# Patient Record
Sex: Female | Born: 1965
Health system: Southern US, Community
[De-identification: ages and names within clinical notes are randomized; demographics above are authoritative.]

## PROBLEM LIST (undated history)

## (undated) DIAGNOSIS — E669 Obesity, unspecified: Secondary | ICD-10-CM

## (undated) DIAGNOSIS — E039 Hypothyroidism, unspecified: Secondary | ICD-10-CM

## (undated) HISTORY — DX: Obesity, unspecified: E66.9

## (undated) HISTORY — PX: LAPAROSCOPIC GASTRIC BANDING: SHX1100

---
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
## 1998-01-09 ENCOUNTER — Emergency Department (HOSPITAL_COMMUNITY): Admission: EM | Admit: 1998-01-09 | Discharge: 1998-01-09 | Payer: Self-pay

## 1998-02-08 ENCOUNTER — Other Ambulatory Visit: Admission: RE | Admit: 1998-02-08 | Discharge: 1998-02-08 | Payer: Self-pay | Admitting: Obstetrics & Gynecology

## 1998-06-11 ENCOUNTER — Encounter: Payer: Self-pay | Admitting: Obstetrics and Gynecology

## 1998-06-11 ENCOUNTER — Inpatient Hospital Stay (HOSPITAL_COMMUNITY): Admission: AD | Admit: 1998-06-11 | Discharge: 1998-06-15 | Payer: Self-pay | Admitting: Obstetrics & Gynecology

## 1998-06-16 ENCOUNTER — Encounter (HOSPITAL_COMMUNITY): Admission: RE | Admit: 1998-06-16 | Discharge: 1998-09-14 | Payer: Self-pay | Admitting: Obstetrics & Gynecology

## 1998-07-08 ENCOUNTER — Other Ambulatory Visit: Admission: RE | Admit: 1998-07-08 | Discharge: 1998-07-08 | Payer: Self-pay | Admitting: Obstetrics & Gynecology

## 2000-05-06 ENCOUNTER — Emergency Department (HOSPITAL_COMMUNITY): Admission: EM | Admit: 2000-05-06 | Discharge: 2000-05-06 | Payer: Self-pay | Admitting: Internal Medicine

## 2001-02-21 ENCOUNTER — Encounter: Admission: RE | Admit: 2001-02-21 | Discharge: 2001-05-22 | Payer: Self-pay | Admitting: Family Medicine

## 2003-10-20 ENCOUNTER — Other Ambulatory Visit: Admission: RE | Admit: 2003-10-20 | Discharge: 2003-10-20 | Payer: Self-pay | Admitting: Obstetrics & Gynecology

## 2005-04-17 ENCOUNTER — Other Ambulatory Visit: Admission: RE | Admit: 2005-04-17 | Discharge: 2005-04-17 | Payer: Self-pay | Admitting: Obstetrics & Gynecology

## 2008-11-06 ENCOUNTER — Ambulatory Visit (HOSPITAL_COMMUNITY): Admission: RE | Admit: 2008-11-06 | Discharge: 2008-11-06 | Payer: Self-pay | Admitting: General Surgery

## 2009-01-18 ENCOUNTER — Encounter: Admission: RE | Admit: 2009-01-18 | Discharge: 2009-04-18 | Payer: Self-pay | Admitting: General Surgery

## 2009-06-03 ENCOUNTER — Encounter: Admission: RE | Admit: 2009-06-03 | Discharge: 2009-09-01 | Payer: Self-pay | Admitting: General Surgery

## 2009-06-29 ENCOUNTER — Ambulatory Visit (HOSPITAL_COMMUNITY): Admission: RE | Admit: 2009-06-29 | Discharge: 2009-06-30 | Payer: Self-pay | Admitting: General Surgery

## 2009-10-11 ENCOUNTER — Encounter
Admission: RE | Admit: 2009-10-11 | Discharge: 2009-10-11 | Payer: Self-pay | Source: Home / Self Care | Attending: General Surgery | Admitting: General Surgery

## 2010-01-11 ENCOUNTER — Encounter
Admission: RE | Admit: 2010-01-11 | Discharge: 2010-02-01 | Payer: Self-pay | Source: Home / Self Care | Attending: General Surgery | Admitting: General Surgery

## 2010-03-20 LAB — SURGICAL PCR SCREEN
MRSA, PCR: NEGATIVE
Staphylococcus aureus: POSITIVE — AB

## 2010-03-20 LAB — CBC
HCT: 34.2 % — ABNORMAL LOW (ref 36.0–46.0)
HCT: 38.5 % (ref 36.0–46.0)
Hemoglobin: 11.6 g/dL — ABNORMAL LOW (ref 12.0–15.0)
Hemoglobin: 12.6 g/dL (ref 12.0–15.0)
MCH: 29.3 pg (ref 26.0–34.0)
MCH: 30.3 pg (ref 26.0–34.0)
MCHC: 32.7 g/dL (ref 30.0–36.0)
MCHC: 34 g/dL (ref 30.0–36.0)
MCV: 89 fL (ref 78.0–100.0)
MCV: 89.6 fL (ref 78.0–100.0)
Platelets: 281 10*3/uL (ref 150–400)
Platelets: 290 10*3/uL (ref 150–400)
RBC: 3.84 MIL/uL — ABNORMAL LOW (ref 3.87–5.11)
RBC: 4.29 MIL/uL (ref 3.87–5.11)
RDW: 15.5 % (ref 11.5–15.5)
RDW: 15.5 % (ref 11.5–15.5)
WBC: 5.2 10*3/uL (ref 4.0–10.5)
WBC: 9.5 10*3/uL (ref 4.0–10.5)

## 2010-03-20 LAB — DIFFERENTIAL
Basophils Absolute: 0 10*3/uL (ref 0.0–0.1)
Basophils Relative: 0 % (ref 0–1)
Eosinophils Absolute: 0 10*3/uL (ref 0.0–0.7)
Eosinophils Absolute: 0.1 10*3/uL (ref 0.0–0.7)
Eosinophils Relative: 0 % (ref 0–5)
Eosinophils Relative: 2 % (ref 0–5)
Lymphocytes Relative: 14 % (ref 12–46)
Lymphocytes Relative: 47 % — ABNORMAL HIGH (ref 12–46)
Lymphs Abs: 1.3 10*3/uL (ref 0.7–4.0)
Lymphs Abs: 2.5 10*3/uL (ref 0.7–4.0)
Monocytes Absolute: 0.5 10*3/uL (ref 0.1–1.0)
Monocytes Absolute: 0.6 10*3/uL (ref 0.1–1.0)
Monocytes Relative: 10 % (ref 3–12)
Monocytes Relative: 6 % (ref 3–12)
Neutro Abs: 2.1 10*3/uL (ref 1.7–7.7)
Neutrophils Relative %: 40 % — ABNORMAL LOW (ref 43–77)

## 2010-03-20 LAB — HEMOGLOBIN AND HEMATOCRIT, BLOOD
HCT: 35.6 % — ABNORMAL LOW (ref 36.0–46.0)
Hemoglobin: 12.2 g/dL (ref 12.0–15.0)

## 2010-03-20 LAB — COMPREHENSIVE METABOLIC PANEL
ALT: 22 U/L (ref 0–35)
Albumin: 4 g/dL (ref 3.5–5.2)
Alkaline Phosphatase: 70 U/L (ref 39–117)
BUN: 8 mg/dL (ref 6–23)
Chloride: 104 mEq/L (ref 96–112)
Glucose, Bld: 81 mg/dL (ref 70–99)
Potassium: 4.4 mEq/L (ref 3.5–5.1)
Sodium: 137 mEq/L (ref 135–145)
Total Bilirubin: 0.9 mg/dL (ref 0.3–1.2)
Total Protein: 7.9 g/dL (ref 6.0–8.3)

## 2012-01-18 ENCOUNTER — Telehealth (INDEPENDENT_AMBULATORY_CARE_PROVIDER_SITE_OTHER): Payer: Self-pay | Admitting: General Surgery

## 2012-01-18 NOTE — Telephone Encounter (Signed)
01/18/12 spoke to patient about making a bariatric surgery follow-up with Dr. Johna Sheriff for her lap band. She was last seen 02/02/10 for a lap band fill adjustment. Pt stated "she is doing fine-doesn't feel that she needs an appt at this time." Also mailed a recall letter as a reminder. (lss)

## 2013-02-12 ENCOUNTER — Ambulatory Visit (INDEPENDENT_AMBULATORY_CARE_PROVIDER_SITE_OTHER): Payer: BC Managed Care – PPO | Admitting: General Surgery

## 2013-02-12 ENCOUNTER — Encounter (INDEPENDENT_AMBULATORY_CARE_PROVIDER_SITE_OTHER): Payer: Self-pay | Admitting: General Surgery

## 2013-02-12 DIAGNOSIS — Z4651 Encounter for fitting and adjustment of gastric lap band: Secondary | ICD-10-CM

## 2013-02-12 NOTE — Progress Notes (Signed)
Chief complaint: Followup lap band for morbid obesity  History: Patient returns for followup with a history of lap band placement in June of 2011. At that time she presented with a BMI of 53.2 at 393 pounds. Following her lap band she initially had very good success after her first several fills. The last time I saw her was February 2012 at which point she had lost 75 pounds and she states she lost another 20 pounds after that 2 of almost 100 pound weight loss. However at that time she had a lot of changes in her life and schedule and job and she has not returned for followup since. She feels that she can't fell off the wagon in terms of her exercise and eating strategies. She also feels that she is not having much restriction anymore from the band. She actually attended a recent seminar for a bariatric program interested in possible revisional surgery. However after going to the seminar she has concerns about the extensive nature of stable procedures and also felt like she never really the "green zone" with her band. She is not having any vomiting or reflux or obstructive symptoms. She feels like she is often not making good food choices at her exercise hs slacked off. She has remained remarkably free of comorbidities. 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 No pertinent past medical history. Past Surgical History  Procedure Laterality Date  . Tonsillectomy  1977  . Cesarean section    . Laparoscopic gastric banding     No current outpatient prescriptions on file.   No current facility-administered medications for this visit.   Not on File Exam: BP 130/86  Pulse 84  Temp(Src) 98.5 F (36.9 C) (Oral)  Resp 16  Ht 6' (1.829 m)  Wt 371 lb 6.4 oz (168.466 kg)  BMI 50.36 kg/m2 Total weight loss 21 pounds, up to 53 pounds from February of 2012 Gen.: Morbidly obese but otherwise well-appearing African female Abdomen: Soft and nontender. Port site looks  fine. No hernias.  Assessment and plan: Morbid obesity, status post lap band placement June 2011. She initially had excellent weight loss but has not had followup in a number of years. We discussed the importance of close regular followup and adjustments with the lap band. In terms of revisional surgery I do not feel that we have really fully utilized her band to its potential and I would not recommend revisional surgery in 2 weeks you are best with the band. She would very much like to try to get back on track with this. We discussed diet and exercise strategies. We're going to give her a refresher course with the dietitian.  We went ahead with a fill today. I accessed her band and found 9.25 cc in and 1/2 cc to bring her up to 9.75 cc. She was able to tolerate water well. We will see her back in 4 weeks and I stressed the importance of regular followup. Based on her initial success I think there is a good chance we can get things back on track.

## 2013-02-12 NOTE — Patient Instructions (Signed)
Begin eating very cautiously with small bites, chewing very well and eating very slowly. Work up to 45 minutes of exercise at least 4 times per week.

## 2013-03-20 ENCOUNTER — Encounter (INDEPENDENT_AMBULATORY_CARE_PROVIDER_SITE_OTHER): Payer: Self-pay

## 2013-03-20 ENCOUNTER — Ambulatory Visit (INDEPENDENT_AMBULATORY_CARE_PROVIDER_SITE_OTHER): Payer: BC Managed Care – PPO | Admitting: Physician Assistant

## 2013-03-20 VITALS — BP 158/98 | HR 82 | Temp 98.6°F | Ht 72.0 in | Wt 369.4 lb

## 2013-03-20 DIAGNOSIS — Z4651 Encounter for fitting and adjustment of gastric lap band: Secondary | ICD-10-CM

## 2013-03-20 NOTE — Patient Instructions (Signed)

## 2013-03-20 NOTE — Progress Notes (Signed)
HISTORY:  Yolanda Sullivan  is a 48 y.o.female who received an AP-Large lap-band in June 2011 by Dr. Johna SheriffHoxworth. She comes in today with 2 lbs weight loss since her last visit with Dr. Johna SheriffHoxworth one month ago. She had been lost to follow-up for about three years with a subsequent 50 lb weight gain. She noticed a significant difference after her fill in February but at this point she feels she could use a smaller adjustment to help get her into the green zone. She has no complaints of regurgitation or reflux.   PHYSICAL EXAM:  Physical exam reveals a very well-appearing 48 y.o.female in no apparent distress  Neurologic: Awake, alert, oriented  Psych: Bright affect, conversant  Respiratory: Breathing even and unlabored. No stridor or wheezing  Abdomen: Soft, nontender, nondistended to palpation. Incisions well-healed. No incisional hernias. Port easily palpated.  Extremities: Atraumatic, good range of motion.  ASSESMENT:  48 y.o. female s/p AP-Large lap-band.  PLAN:  The patient's port was accessed with a 20G Huber needle without difficulty. Clear fluid was aspirated and 0.25 mL saline was added to the port to give a total predicted volume of 10 mL. The patient was able to swallow water without difficulty following the procedure and was instructed to take clear liquids for the next 24-48 hours and advance slowly as tolerated.

## 2013-03-27 ENCOUNTER — Encounter: Payer: Self-pay | Admitting: Dietician

## 2013-03-27 ENCOUNTER — Encounter: Payer: BC Managed Care – PPO | Attending: General Surgery | Admitting: Dietician

## 2013-03-27 DIAGNOSIS — Z713 Dietary counseling and surveillance: Secondary | ICD-10-CM | POA: Insufficient documentation

## 2013-03-27 NOTE — Patient Instructions (Signed)
Goals:  Follow the Pre-Op Diet for 2 Weeks, then:   Follow Bariatric Surgery Specialized Post-Op Diet   Aim for maximum of 15 grams of carbs per meal/15-20 grams per snack  Avoid white starches and starchy veggies (potatoes, peas, corn, etc)  Eat 3-6 small meals/snacks, every 3-5 hrs  Increase lean protein foods to meet 60-80g goal  Always have a protein source with carbs  Increase fluid intake to 64oz +  Don't 15 minutes before up through 30 minutes after a meal or snack  Aim for >30 min of physical activity daily   Resume daily vitamin supplementation   Calcium and multivitamin

## 2013-03-27 NOTE — Progress Notes (Signed)
  Medical Nutrition Therapy:  Appt start time: 0930 end time:  1010.  Primary concerns today: Post-operative Bariatric Surgery Nutrition Management. Yolanda Sullivan is here today for a bariatric refresher. Had surgery (LAGB) in 2011 and lost about 70-80 lbs but has since since gained it back. Had about 1 or 2 fills about surgery but never found a green zone. Starting seeing Dr. Johna SheriffHoxworth again about 1 month ago and has 2 fill since. Now feels like not quite in the green zone yet (in yellow zone).   States she feels very ready to jump start back into losing weight  Preferred Learning Style:   No preference indicated   Learning Readiness:   Ready  24-hr recall: B (AM): coffee with oatmeal and banana or sausage egg and cheese croissant Snk (AM): not usually, sometimes almonds  L (PM): sandwich with [place] or tuna club or big salad with eggs, cheese, [place] and sometimes tuna, pineapples, raisins, veggies Snk (PM): sometimes will have a candy bar  D (PM): meat with vegetable and a starch  Snk (PM): none  Fluid intake: water or diet green tea or minute maid light lemonade (about 60-70 oz) Estimated total protein intake: unknown  Medications: none Supplementation: not taking  Using straws: Not unless at a restaurant Drinking while eating: Yes Hair loss: No Carbonated beverages: sparkling wine occassionally  N/V/D/C: vomited 2 x after d/t not chewing well  Last Lap-Band fill: 03/20/2013, 0.25 cc added total of 10 cc  Recent physical activity:  zumba 1-2 x week for 60 minutes  Progress Towards Goal(s):  In progress.  Handouts given during visit include:  Pre-Op Diet  Special Bariatric Post Op Diet   Nutritional Diagnosis:  [place]-3.3 Overweight/obesity related to past poor dietary habits and physical inactivity as evidenced by patient w/ hx of  LAGB surgery following dietary guidelines for continued weight loss.    Intervention:  Nutrition education.  Teaching Method Utilized:   Visual Auditory Hands on  Barriers to learning/adherence to lifestyle change: none  Demonstrated degree of understanding via:  Teach Back   Monitoring/Evaluation:  Dietary intake, exercise, lap band fills, and body weight. Follow up in 6 weeks

## 2013-04-24 ENCOUNTER — Encounter (INDEPENDENT_AMBULATORY_CARE_PROVIDER_SITE_OTHER): Payer: BC Managed Care – PPO

## 2013-05-08 ENCOUNTER — Ambulatory Visit: Payer: BC Managed Care – PPO | Admitting: Dietician

## 2013-05-08 ENCOUNTER — Encounter (INDEPENDENT_AMBULATORY_CARE_PROVIDER_SITE_OTHER): Payer: BC Managed Care – PPO

## 2013-05-15 ENCOUNTER — Ambulatory Visit: Payer: BC Managed Care – PPO | Admitting: Dietician

## 2013-05-29 ENCOUNTER — Encounter (INDEPENDENT_AMBULATORY_CARE_PROVIDER_SITE_OTHER): Payer: BC Managed Care – PPO

## 2013-06-05 ENCOUNTER — Encounter (INDEPENDENT_AMBULATORY_CARE_PROVIDER_SITE_OTHER): Payer: BC Managed Care – PPO

## 2013-06-11 ENCOUNTER — Ambulatory Visit: Payer: BC Managed Care – PPO | Admitting: Dietician

## 2013-06-19 ENCOUNTER — Encounter (INDEPENDENT_AMBULATORY_CARE_PROVIDER_SITE_OTHER): Payer: BC Managed Care – PPO

## 2014-05-12 ENCOUNTER — Other Ambulatory Visit: Payer: Self-pay | Admitting: Obstetrics & Gynecology

## 2014-05-12 DIAGNOSIS — N6312 Unspecified lump in the right breast, upper inner quadrant: Secondary | ICD-10-CM

## 2014-05-13 ENCOUNTER — Other Ambulatory Visit: Payer: Self-pay

## 2014-05-18 ENCOUNTER — Ambulatory Visit
Admission: RE | Admit: 2014-05-18 | Discharge: 2014-05-18 | Disposition: A | Discharge status: Home / Self Care | Payer: BLUE CROSS/BLUE SHIELD | Source: Ambulatory Visit | Attending: Obstetrics & Gynecology | Admitting: Obstetrics & Gynecology

## 2014-05-18 ENCOUNTER — Other Ambulatory Visit: Payer: Self-pay | Admitting: Obstetrics & Gynecology

## 2014-05-18 ENCOUNTER — Encounter (INDEPENDENT_AMBULATORY_CARE_PROVIDER_SITE_OTHER): Payer: Self-pay

## 2014-05-18 DIAGNOSIS — N6312 Unspecified lump in the right breast, upper inner quadrant: Secondary | ICD-10-CM

## 2015-10-08 ENCOUNTER — Encounter (HOSPITAL_COMMUNITY): Payer: Self-pay

## 2017-01-29 ENCOUNTER — Encounter (HOSPITAL_COMMUNITY): Payer: Self-pay

## 2017-03-20 DIAGNOSIS — H6120 Impacted cerumen, unspecified ear: Secondary | ICD-10-CM | POA: Diagnosis not present

## 2017-06-11 DIAGNOSIS — Z1231 Encounter for screening mammogram for malignant neoplasm of breast: Secondary | ICD-10-CM | POA: Diagnosis not present

## 2017-06-11 DIAGNOSIS — Z01419 Encounter for gynecological examination (general) (routine) without abnormal findings: Secondary | ICD-10-CM | POA: Diagnosis not present

## 2017-06-11 DIAGNOSIS — Z6841 Body Mass Index (BMI) 40.0 and over, adult: Secondary | ICD-10-CM | POA: Diagnosis not present

## 2017-06-23 ENCOUNTER — Emergency Department (HOSPITAL_BASED_OUTPATIENT_CLINIC_OR_DEPARTMENT_OTHER): Payer: BLUE CROSS/BLUE SHIELD

## 2017-06-23 ENCOUNTER — Other Ambulatory Visit: Payer: Self-pay

## 2017-06-23 ENCOUNTER — Emergency Department (HOSPITAL_BASED_OUTPATIENT_CLINIC_OR_DEPARTMENT_OTHER)
Admission: EM | Admit: 2017-06-23 | Discharge: 2017-06-23 | Disposition: A | Discharge status: Home / Self Care | Payer: BLUE CROSS/BLUE SHIELD | Attending: Emergency Medicine | Admitting: Emergency Medicine

## 2017-06-23 ENCOUNTER — Encounter (HOSPITAL_BASED_OUTPATIENT_CLINIC_OR_DEPARTMENT_OTHER): Payer: Self-pay | Admitting: Emergency Medicine

## 2017-06-23 DIAGNOSIS — M1712 Unilateral primary osteoarthritis, left knee: Secondary | ICD-10-CM

## 2017-06-23 DIAGNOSIS — M25562 Pain in left knee: Secondary | ICD-10-CM | POA: Diagnosis not present

## 2017-06-23 DIAGNOSIS — M7122 Synovial cyst of popliteal space [Baker], left knee: Secondary | ICD-10-CM

## 2017-06-23 DIAGNOSIS — R030 Elevated blood-pressure reading, without diagnosis of hypertension: Secondary | ICD-10-CM | POA: Diagnosis not present

## 2017-06-23 DIAGNOSIS — S8992XA Unspecified injury of left lower leg, initial encounter: Secondary | ICD-10-CM | POA: Diagnosis not present

## 2017-06-23 MED ORDER — MELOXICAM 15 MG PO TABS: 15.0000 mg | ORAL_TABLET | Freq: Every day | ORAL | 0 refills | Status: DC

## 2017-06-23 NOTE — ED Triage Notes (Signed)
Patient states that she is having pain to her kne since last Saturday - the patient states that she had hurt her left knee many years ago. Patient states that she re-injuried it today

## 2017-06-23 NOTE — ED Provider Notes (Signed)
[facility] EMERGENCY DEPARTMENT Provider Note   CSN: [id number] Arrival date & time: 06/23/17  1658     History   Chief Complaint Chief Complaint  Patient presents with  . Knee Pain    HPI Yolanda Sullivan is a 52 y.o. female with a PMHx of morbid obesity s/p lap band procedure, who presents to the ED with complaints of left knee pain x1 week.  Patient states that last weekend she was on her feet more and was more active than usual, her left knee started bothering her.  Today she twisted her knee and the pain worsened so she came in for evaluation.  She describes the pain as 9/10 constant aching and throbbing left knee pain that radiates somewhat into the lower thigh, worse with walking and initially getting up after being seated, and unrelieved with ice, heat, Advil, and Aspercreme.  She reports associated left knee swelling, as well as tingling/paresthesia in the left lateral thigh.  She denies having any issues with her knee in the past, denies having an orthopedist.  She denies falling on her knee or any other trauma or injury.  She denies any lower leg or calf swelling, numbness, focal weakness, bruises, abrasions, warmth, or any other complaints at this time.  Of note, she denies having any other medical conditions, denies having hypertension, states that she just had a full physical last week at her OB/GYN's office and her systolic blood pressure was in the 140s as it usually is.  She is not on any medications for blood pressure.  Chart review reveals that in 2015, which is the last visit in our system, her blood pressure was in the 150s/90s, however we don't have any more recent visits to compare to.  She denies any headache, vision changes, CP, SOB, abdominal pain, nausea, vomiting, or any other complaints or symptoms related to elevated blood pressure.  The history is provided by the patient and medical records. No language interpreter was used.  Knee Pain   Pertinent  negatives include no numbness.    Past Medical History:  Diagnosis Date  . Obesity     Patient Active Problem List   Diagnosis Date Noted  . Morbid obesity (HCC) 02/12/2013    Past Surgical History:  Procedure Laterality Date  . CESAREAN SECTION    . LAPAROSCOPIC GASTRIC BANDING    . TONSILLECTOMY  1977     OB History   None      Home Medications    Prior to Admission medications   Not on File    Family History Family History  Problem Relation Age of Onset  . Cancer Father        throat  . Hyperlipidemia Other   . Diabetes Other   . Hypertension Other     Social History Social History   Tobacco Use  . Smoking status: Never Smoker  . Smokeless tobacco: Never Used  Substance Use Topics  . Alcohol use: Yes    Alcohol/week: 1.2 oz    Types: 2 Glasses of wine per week    Comment: weekly  . Drug use: No     Allergies   Patient has no known allergies.   Review of Systems Review of Systems  Eyes: Negative for visual disturbance.  Respiratory: Negative for shortness of breath.   Cardiovascular: Negative for chest pain and leg swelling.  Gastrointestinal: Negative for abdominal pain, nausea and vomiting.  Musculoskeletal: Positive for arthralgias and joint swelling.  Skin: Negative for  color change.  Allergic/Immunologic: Negative for immunocompromised state.  Neurological: Negative for weakness, numbness and headaches.  Psychiatric/Behavioral: Negative for confusion.   All other systems reviewed and are negative for acute change except as noted in the HPI.    Physical Exam Updated Vital Signs BP (!) 156/67 (BP Location: Left Arm)   Pulse 82   Temp 98.1 F (36.7 C) (Oral)   Resp 20   Ht 5\' 11"  (1.803 m)   Wt (!) 158.8 kg (350 lb)   SpO2 97%   BMI 48.82 kg/m   Physical Exam  Constitutional: She is oriented to person, place, and time. Vital signs are normal. She appears well-developed and well-nourished.  Non-toxic appearance. No distress.    Afebrile, nontoxic, NAD, BP 156/67 on exam  HENT:  Head: Normocephalic and atraumatic.  Mouth/Throat: Mucous membranes are normal.  Eyes: Conjunctivae and EOM are normal. Right eye exhibits no discharge. Left eye exhibits no discharge.  Neck: Normal range of motion. Neck supple.  Cardiovascular: Normal rate and intact distal pulses.  Tachycardic in triage but HR 80s during exam  Pulmonary/Chest: Effort normal. No respiratory distress.  Abdominal: Normal appearance. She exhibits no distension.  Musculoskeletal: Normal range of motion.       Left knee: She exhibits swelling and effusion. She exhibits normal range of motion, no ecchymosis, no deformity, no laceration, no erythema, normal alignment, no LCL laxity, normal patellar mobility and no MCL laxity. Tenderness found.  L knee with FROM intact, with mild diffuse joint line TTP, ?palpable baker's cyst to medial popliteal fossa area which is also TTP, mild joint swelling/effusion, no crepitus or deformity, no bruising or erythema, no warmth, no abnormal alignment or patellar mobility, no varus/valgus laxity, neg anterior drawer test. Trace pedal edema symmetrically bilaterally, no calf tenderness/neg homan's sign bilaterally.  Strength and sensation grossly intact, distal pulses intact, compartments soft   Neurological: She is alert and oriented to person, place, and time. She has normal strength. No sensory deficit.  Skin: Skin is warm, dry and intact. No rash noted.  Psychiatric: She has a normal mood and affect. Her behavior is normal.  Nursing note and vitals reviewed.    ED Treatments / Results  Labs (all labs ordered are listed, but only abnormal results are displayed) Labs Reviewed - No data to display  EKG None  Radiology Dg Knee Complete 4 Views Left  Result Date: 06/23/2017 CLINICAL DATA:  Knee pain after twisting injury. EXAM: LEFT KNEE - COMPLETE 4+ VIEW COMPARISON:  None. FINDINGS: No acute fracture or dislocation.  Small joint effusion. Small tricompartmental osteophytes. Mild medial and patellofemoral compartment joint space narrowing. Bone mineralization is normal. Soft tissues are unremarkable. IMPRESSION: 1.  No acute osseous abnormality. 2. Mild osteoarthritis. Electronically Signed   By: Obie Dredge M.D.   On: 06/23/2017 17:59    Procedures Procedures (including critical care time)  Medications Ordered in ED Medications - No data to display   Initial Impression / Assessment and Plan / ED Course  I have reviewed the triage vital signs and the nursing notes.  Pertinent labs & imaging results that were available during my care of the patient were reviewed by me and considered in my medical decision making (see chart for details).     52 y.o. female here with L knee pain x1 week, had been on her feet more than usual last weekend and had pain start up; today twisted knee and pain worsened. On exam, trace effusion/swelling around L knee, ?palpable  baker's cyst in medial popliteal fossa which is TTP, diffuse joint line TTP, no erythema/warmth, trace b/l pedal edema which is symmetric, no calf tenderness. NVI with soft compartments. Xray obtained in triage reveals mild osteoarthritis but otherwise no acute abnormality. Symptoms likely from OA and increased strain from recent increased activity, doubt DVT or other acute emergent pathology. Will give knee sleeve and crutches for comfort, advised RICE, will rx mobic, discussed use of tylenol as needed for additional relief of symptoms, and f/up with orthopedist in 1-2wks for recheck and ongoing management of knee pain. Of note, initial BP elevated at 193/76 and HR 108 however on recheck BP 156/67 and HR 82; the initial ones were either falsely elevated due to poor cuff size or positioning, or potentially in some part due to pain. Chart review reveals that her BP has been in the 150s systolic in the past (68yrs ago is the last reading we have). Pt without  symptoms of HTN, doubt need for further emergent work up of this at this time. Advised f/up with PCP for this. DASH diet advised. I explained the diagnosis and have given explicit precautions to return to the ER including for any other new or worsening symptoms. The patient understands and accepts the medical plan as it's been dictated and I have answered their questions. Discharge instructions concerning home care and prescriptions have been given. The patient is STABLE and is discharged to home in good condition.    Final Clinical Impressions(s) / ED Diagnoses   Final diagnoses:  Acute pain of left knee  Osteoarthritis of left knee, unspecified osteoarthritis type  Baker's cyst of knee, left  Elevated blood pressure reading    ED Discharge Orders        Ordered    meloxicam (MOBIC) 15 MG tablet  Daily     06/23/17 [address], [place], [place] 06/23/17 1914    Charlynne Pander, MD 06/23/17 [phone]

## 2017-06-23 NOTE — Discharge Instructions (Addendum)
Wear knee sleeve for compression of the knee, to help with pain and swelling. Use crutches as needed for comfort. Ice and elevate knee throughout the day, using ice pack for no more than 20 minutes every hour. Use mobic daily as directed, don't use additional NSAIDs like ibuprofen/aleve/etc while taking mobic. Use additional tylenol as needed for additional pain relief. Call orthopedic follow up today or tomorrow to schedule followup appointment for recheck of ongoing knee pain in 1-2 weeks. Return to the ER for changes or worsening symptoms.  Also, your blood pressure was a little high today, which could just be from being in pain; eat a low salt/low sodium diet and follow up with your regular doctor for ongoing evaluation/management of your blood pressure.

## 2017-07-10 DIAGNOSIS — M25562 Pain in left knee: Secondary | ICD-10-CM | POA: Diagnosis not present

## 2017-07-10 DIAGNOSIS — M1712 Unilateral primary osteoarthritis, left knee: Secondary | ICD-10-CM | POA: Diagnosis not present

## 2017-07-18 DIAGNOSIS — E039 Hypothyroidism, unspecified: Secondary | ICD-10-CM | POA: Diagnosis not present

## 2017-07-30 DIAGNOSIS — M1712 Unilateral primary osteoarthritis, left knee: Secondary | ICD-10-CM | POA: Diagnosis not present

## 2017-08-02 DIAGNOSIS — M1712 Unilateral primary osteoarthritis, left knee: Secondary | ICD-10-CM | POA: Diagnosis not present

## 2017-08-06 DIAGNOSIS — M1712 Unilateral primary osteoarthritis, left knee: Secondary | ICD-10-CM | POA: Diagnosis not present

## 2017-08-09 DIAGNOSIS — M1712 Unilateral primary osteoarthritis, left knee: Secondary | ICD-10-CM | POA: Diagnosis not present

## 2017-08-13 DIAGNOSIS — M1712 Unilateral primary osteoarthritis, left knee: Secondary | ICD-10-CM | POA: Diagnosis not present

## 2017-08-17 DIAGNOSIS — M1712 Unilateral primary osteoarthritis, left knee: Secondary | ICD-10-CM | POA: Diagnosis not present

## 2017-08-22 DIAGNOSIS — M1712 Unilateral primary osteoarthritis, left knee: Secondary | ICD-10-CM | POA: Diagnosis not present

## 2017-08-27 DIAGNOSIS — M1712 Unilateral primary osteoarthritis, left knee: Secondary | ICD-10-CM | POA: Diagnosis not present

## 2017-08-28 DIAGNOSIS — M25562 Pain in left knee: Secondary | ICD-10-CM | POA: Diagnosis not present

## 2017-08-28 DIAGNOSIS — M1712 Unilateral primary osteoarthritis, left knee: Secondary | ICD-10-CM | POA: Diagnosis not present

## 2017-08-30 DIAGNOSIS — M1712 Unilateral primary osteoarthritis, left knee: Secondary | ICD-10-CM | POA: Diagnosis not present

## 2017-09-05 DIAGNOSIS — M1712 Unilateral primary osteoarthritis, left knee: Secondary | ICD-10-CM | POA: Diagnosis not present

## 2017-09-06 DIAGNOSIS — M1712 Unilateral primary osteoarthritis, left knee: Secondary | ICD-10-CM | POA: Diagnosis not present

## 2017-09-07 DIAGNOSIS — M1712 Unilateral primary osteoarthritis, left knee: Secondary | ICD-10-CM | POA: Diagnosis not present

## 2017-09-10 DIAGNOSIS — M1712 Unilateral primary osteoarthritis, left knee: Secondary | ICD-10-CM | POA: Diagnosis not present

## 2017-09-12 DIAGNOSIS — E039 Hypothyroidism, unspecified: Secondary | ICD-10-CM | POA: Diagnosis not present

## 2017-09-14 DIAGNOSIS — M1712 Unilateral primary osteoarthritis, left knee: Secondary | ICD-10-CM | POA: Diagnosis not present

## 2017-09-24 DIAGNOSIS — M1712 Unilateral primary osteoarthritis, left knee: Secondary | ICD-10-CM | POA: Diagnosis not present

## 2017-10-08 DIAGNOSIS — M1712 Unilateral primary osteoarthritis, left knee: Secondary | ICD-10-CM | POA: Diagnosis not present

## 2017-11-14 DIAGNOSIS — E039 Hypothyroidism, unspecified: Secondary | ICD-10-CM | POA: Diagnosis not present

## 2018-02-01 DIAGNOSIS — E039 Hypothyroidism, unspecified: Secondary | ICD-10-CM | POA: Diagnosis not present

## 2018-02-01 DIAGNOSIS — Z23 Encounter for immunization: Secondary | ICD-10-CM | POA: Diagnosis not present

## 2018-02-01 DIAGNOSIS — Z1211 Encounter for screening for malignant neoplasm of colon: Secondary | ICD-10-CM | POA: Diagnosis not present

## 2018-02-27 ENCOUNTER — Other Ambulatory Visit: Payer: Self-pay | Admitting: Gastroenterology

## 2018-02-27 DIAGNOSIS — Z01818 Encounter for other preprocedural examination: Secondary | ICD-10-CM | POA: Diagnosis not present

## 2018-04-18 ENCOUNTER — Ambulatory Visit (HOSPITAL_COMMUNITY)
Admission: RE | Admit: 2018-04-18 | Payer: BLUE CROSS/BLUE SHIELD | Source: Home / Self Care | Admitting: Gastroenterology

## 2018-04-18 ENCOUNTER — Encounter (HOSPITAL_COMMUNITY): Admission: RE | Payer: Self-pay | Source: Home / Self Care

## 2018-04-18 SURGERY — COLONOSCOPY
Anesthesia: Monitor Anesthesia Care

## 2018-06-27 DIAGNOSIS — Z01419 Encounter for gynecological examination (general) (routine) without abnormal findings: Secondary | ICD-10-CM | POA: Diagnosis not present

## 2018-06-27 DIAGNOSIS — Z6841 Body Mass Index (BMI) 40.0 and over, adult: Secondary | ICD-10-CM | POA: Diagnosis not present

## 2018-06-27 DIAGNOSIS — Z1231 Encounter for screening mammogram for malignant neoplasm of breast: Secondary | ICD-10-CM | POA: Diagnosis not present

## 2018-07-03 ENCOUNTER — Other Ambulatory Visit: Payer: Self-pay | Admitting: Gastroenterology

## 2018-07-16 ENCOUNTER — Other Ambulatory Visit: Payer: Self-pay | Admitting: Gastroenterology

## 2018-07-16 ENCOUNTER — Other Ambulatory Visit (HOSPITAL_COMMUNITY)
Admission: RE | Admit: 2018-07-16 | Discharge: 2018-07-16 | Disposition: A | Discharge status: Home / Self Care | Payer: BC Managed Care – PPO | Source: Ambulatory Visit | Attending: Gastroenterology | Admitting: Gastroenterology

## 2018-07-16 DIAGNOSIS — Z1159 Encounter for screening for other viral diseases: Secondary | ICD-10-CM | POA: Diagnosis not present

## 2018-07-16 LAB — SARS CORONAVIRUS 2 (TAT 6-24 HRS): SARS Coronavirus 2: NEGATIVE

## 2018-07-17 NOTE — H&P (Signed)
History of Present Illness  General:  53 year old female was referred for a screening colonoscopy. No prior colonoscopy. There is no family history of colon cancer. She has 2-3 Bms/day and denies blood in stool or black stools. She has intentional weight loss of 20 lbs since 11/19 and has a good appetite. She denies nausea,vomitng, acid reflux, heartburn, denies difficulty or pain on swallowing.   Vital Signs  Wt 389.7, Wt change -.3 lb, Ht 70, BMI 55.91, Temp 98.2, Pulse sitting 82, BP sitting 158/78.   Examination  Gastroenterology:: GENERAL APPEARANCE: Well developed, morbidly obese, no active distress, pleasant.  EYES: Lids and conjunctiva normal. Sclera normal.  ORAL CAVITY: Lips, teeth and gums are normal. Pharynx, tongue, mucosa normal .  SCLERA: anicteric .  CARDIOVASCULAR RRR no murmur, Normal RRR w/o murmers or gallops. No peripheral edema .  RESPIRATORY Breath sounds normal. Respiration even and unlabored .  ABDOMEN No masses palpated. Liver and spleen not palpated, normal. Bowel sounds normal, Abdomen not distended .  EXTREMITIES: No edema, pulses intact .  NEURO: normal strength, normal gait .  PSYCH: mood/affect normal .      Current Medications  Taking   Levothyroxine Sodium 75 MCG Capsule 1 capsule in the morning on an empty stomach Orally Once a day   Triamcinolone Acetonide 0.5 % Cream 1 application Externally Two times a Week, Notes: eczema   Medication List reviewed and reconciled with the patient    Past Medical History  Hypothyroid.   Morbid Obesity.    Surgical History  tonsillectomy 1977  C section 2000  Lap Band procedure 2011   Family History  Father: deceased, throat cancer, smoker  Mother: alive, high cholesterol  1 son(s) .   No Family History of Colon Cancer, Polyps, or Liver Disease.   Social History  General:  Alcohol: yes, occasionally, wine.  Tobacco use  cigarettes: Never smoked Tobacco history last updated 07/16/2018 no  Recreational drug use.    Allergies  N.K.D.A.   Hospitalization/Major Diagnostic Procedure  childbirth   not in the past yr 07/2018   Review of Systems  GI PROCEDURE:  no Pacemaker/ AICD, no. no Artificial heart valves. no MI/heart attack. no Abnormal heart rhythm. no Angina. no CVA. no Hypertension. no Hypotension. no Asthma, COPD. no Sleep apnea. no Seizure disorders. no Artificial joints. no Severe DJD. no Diabetes. no Significant headaches. no Vertigo. no Depression/anxiety. no Abnormal bleeding. no Kidney Disease. no Liver disease, no. no Chance of pregnancy. no Blood transfusion. no Method of Birth Control. no Birth control pills.      Assessments   1. Screen for colon cancer - Z12.11 (Primary)   Treatment  1. Screen for colon cancer  IMAGING: Colonoscopy    Whitfield,Dia 02/27/2018 10:43:54 AM > called endo scheduling and spoke with Kendall-scheduled for 04/18/2018 at [facility] at 11am-arrival time 10am-prep instructions reviewed with pt.   Notes: The risks and benefits of the procedure were discussed with the patient in details.She understands and verbalizes consent.She will be given written instructions, prescription for preparation and will be scheduled for the same.     Ronnette Juniper, MD

## 2018-07-18 NOTE — Anesthesia Preprocedure Evaluation (Addendum)
Anesthesia Evaluation  Patient identified by MRN, date of birth, ID band Patient awake    Reviewed: Allergy & Precautions, NPO status , Patient's Chart, lab work & pertinent test results  History of Anesthesia Complications Negative for: history of anesthetic complications  Airway Mallampati: II  TM Distance: >3 FB Neck ROM: Full    Dental  (+) Missing,    Pulmonary neg pulmonary ROS,    Pulmonary exam normal        Cardiovascular negative cardio ROS Normal cardiovascular exam     Neuro/Psych negative neurological ROS     GI/Hepatic negative GI ROS, Neg liver ROS,   Endo/Other  Hypothyroidism Morbid obesity (BMI 56)  Renal/GU negative Renal ROS     Musculoskeletal negative musculoskeletal ROS (+)   Abdominal   Peds  Hematology negative hematology ROS (+)   Anesthesia Other Findings Day of surgery medications reviewed with the patient.  Reproductive/Obstetrics                           Anesthesia Physical Anesthesia Plan  ASA: III  Anesthesia Plan: MAC   Post-op Pain Management:    Induction:   PONV Risk Score and Plan: 2 and Treatment may vary due to age or medical condition and Propofol infusion  Airway Management Planned: Natural Airway and Simple Face Mask  Additional Equipment:   Intra-op Plan:   Post-operative Plan:   Informed Consent: I have reviewed the patients History and Physical, chart, labs and discussed the procedure including the risks, benefits and alternatives for the proposed anesthesia with the patient or authorized representative who has indicated his/her understanding and acceptance.     Dental advisory given  Plan Discussed with: CRNA  Anesthesia Plan Comments:        Anesthesia Quick Evaluation

## 2018-07-19 ENCOUNTER — Ambulatory Visit (HOSPITAL_COMMUNITY): Payer: BC Managed Care – PPO | Admitting: Anesthesiology

## 2018-07-19 ENCOUNTER — Encounter (HOSPITAL_COMMUNITY): Payer: Self-pay | Admitting: Certified Registered Nurse Anesthetist

## 2018-07-19 ENCOUNTER — Ambulatory Visit (HOSPITAL_COMMUNITY)
Admission: RE | Admit: 2018-07-19 | Discharge: 2018-07-19 | Disposition: A | Discharge status: Home / Self Care | Payer: BC Managed Care – PPO | Attending: Gastroenterology | Admitting: Gastroenterology

## 2018-07-19 ENCOUNTER — Encounter (HOSPITAL_COMMUNITY)
Admission: RE | Disposition: A | Discharge status: Home / Self Care | Payer: Self-pay | Source: Home / Self Care | Attending: Gastroenterology

## 2018-07-19 ENCOUNTER — Other Ambulatory Visit: Payer: Self-pay

## 2018-07-19 DIAGNOSIS — Z9884 Bariatric surgery status: Secondary | ICD-10-CM | POA: Insufficient documentation

## 2018-07-19 DIAGNOSIS — K5730 Diverticulosis of large intestine without perforation or abscess without bleeding: Secondary | ICD-10-CM | POA: Insufficient documentation

## 2018-07-19 DIAGNOSIS — K621 Rectal polyp: Secondary | ICD-10-CM | POA: Insufficient documentation

## 2018-07-19 DIAGNOSIS — Z6841 Body Mass Index (BMI) 40.0 and over, adult: Secondary | ICD-10-CM | POA: Diagnosis not present

## 2018-07-19 DIAGNOSIS — E039 Hypothyroidism, unspecified: Secondary | ICD-10-CM | POA: Diagnosis not present

## 2018-07-19 DIAGNOSIS — Z1211 Encounter for screening for malignant neoplasm of colon: Secondary | ICD-10-CM | POA: Diagnosis not present

## 2018-07-19 DIAGNOSIS — Z79890 Hormone replacement therapy (postmenopausal): Secondary | ICD-10-CM | POA: Insufficient documentation

## 2018-07-19 DIAGNOSIS — K648 Other hemorrhoids: Secondary | ICD-10-CM | POA: Insufficient documentation

## 2018-07-19 DIAGNOSIS — K635 Polyp of colon: Secondary | ICD-10-CM | POA: Insufficient documentation

## 2018-07-19 DIAGNOSIS — D123 Benign neoplasm of transverse colon: Secondary | ICD-10-CM | POA: Diagnosis not present

## 2018-07-19 HISTORY — PX: COLONOSCOPY WITH PROPOFOL: SHX5780

## 2018-07-19 HISTORY — DX: Hypothyroidism, unspecified: E03.9

## 2018-07-19 HISTORY — PX: BIOPSY: SHX5522

## 2018-07-19 HISTORY — PX: POLYPECTOMY: SHX5525

## 2018-07-19 SURGERY — COLONOSCOPY WITH PROPOFOL
Anesthesia: Monitor Anesthesia Care

## 2018-07-19 MED ORDER — LACTATED RINGERS IV SOLN
INTRAVENOUS | Status: DC
  Administered 2018-07-19: 07:00:00 via INTRAVENOUS

## 2018-07-19 MED ORDER — PROPOFOL 10 MG/ML IV BOLUS
INTRAVENOUS | Status: AC
  Filled 2018-07-19: qty 20

## 2018-07-19 MED ORDER — ONDANSETRON HCL 4 MG/2ML IJ SOLN
INTRAMUSCULAR | Status: DC | PRN
  Administered 2018-07-19: 4 mg via INTRAVENOUS

## 2018-07-19 MED ORDER — PROPOFOL 10 MG/ML IV BOLUS
INTRAVENOUS | Status: DC | PRN
  Administered 2018-07-19: 20 mg via INTRAVENOUS

## 2018-07-19 MED ORDER — SODIUM CHLORIDE 0.9 % IV SOLN: INTRAVENOUS | Status: DC

## 2018-07-19 MED ORDER — PROPOFOL 10 MG/ML IV BOLUS
INTRAVENOUS | Status: AC
  Filled 2018-07-19: qty 40

## 2018-07-19 MED ORDER — PROPOFOL 500 MG/50ML IV EMUL
INTRAVENOUS | Status: DC | PRN
  Administered 2018-07-19: 150 ug/kg/min via INTRAVENOUS

## 2018-07-19 SURGICAL SUPPLY — 22 items

## 2018-07-19 NOTE — Anesthesia Postprocedure Evaluation (Signed)
Anesthesia Post Note  Patient: Yolanda Sullivan  Procedure(s) Performed: COLONOSCOPY WITH PROPOFOL (N/A ) BIOPSY POLYPECTOMY     Patient location during evaluation: PACU Anesthesia Type: MAC Level of consciousness: awake and alert Pain management: pain level controlled Vital Signs Assessment: post-procedure vital signs reviewed and stable Respiratory status: spontaneous breathing, nonlabored ventilation and respiratory function stable Cardiovascular status: blood pressure returned to baseline and stable Postop Assessment: no apparent nausea or vomiting Anesthetic complications: no    Last Vitals:  Vitals:   07/19/18 0707 07/19/18 0850  BP: (!) 170/85 (!) 98/47  Pulse: 74 67  Resp: 19 17  Temp: (!) 36.3 C 36.4 C  SpO2: 99% 100%    Last Pain:  Vitals:   07/19/18 0850  TempSrc: Temporal  PainSc:                  Brennan Bailey

## 2018-07-19 NOTE — Interval H&P Note (Signed)
History and Physical Interval Note: 52/female with a BMI of 55.91 for initial average risk screening colonoscopy. 07/19/2018 7:58 AM  Yolanda Sullivan  has presented today for colonoscopy, with the diagnosis of screening.  The various methods of treatment have been discussed with the patient and family. After consideration of risks, benefits and other options for treatment, the patient has consented to  Procedure(s): COLONOSCOPY WITH PROPOFOL (N/A) as a surgical intervention.  The patient's history has been reviewed, patient examined, no change in status, stable for surgery.  I have reviewed the patient's chart and labs.  Questions were answered to the patient's satisfaction.     Ronnette Juniper

## 2018-07-19 NOTE — Transfer of Care (Signed)
Immediate Anesthesia Transfer of Care Note  Patient: Yolanda Sullivan  Procedure(s) Performed: COLONOSCOPY WITH PROPOFOL (N/A ) BIOPSY  Patient Location: PACU and Endoscopy Unit  Anesthesia Type:MAC  Level of Consciousness: awake, alert  and oriented  Airway & Oxygen Therapy: Patient Spontanous Breathing and Patient connected to face mask oxygen  Post-op Assessment: Report given to RN and Post -op Vital signs reviewed and stable  Post vital signs: Reviewed and stable  Last Vitals:  Vitals Value Taken Time  BP    Temp    Pulse 67 07/19/18 0850  Resp 16 07/19/18 0850  SpO2 100 % 07/19/18 0850  Vitals shown include unvalidated device data.  Last Pain:  Vitals:   07/19/18 0707  TempSrc: Temporal  PainSc: 0-No pain         Complications: No apparent anesthesia complications

## 2018-07-19 NOTE — Brief Op Note (Signed)
07/19/2018  8:54 AM  PATIENT:  Yolanda Sullivan  53 y.o. female  PRE-OPERATIVE DIAGNOSIS:  screening  POST-OPERATIVE DIAGNOSIS:  diverticulosis, polyp removals  PROCEDURE:  Procedure(s): COLONOSCOPY WITH PROPOFOL (N/A) BIOPSY  SURGEON:  Surgeon(s) and Role:    Ronnette Juniper, MD - Primary  PHYSICIAN ASSISTANT:   ASSISTANTS:Julie Benedict Needy, Tech  ANESTHESIA:   MAC  EBL:  Minimal  BLOOD ADMINISTERED:none  DRAINS: none   LOCAL MEDICATIONS USED:  NONE  SPECIMEN:  Biopsy / Limited Resection  DISPOSITION OF SPECIMEN:  PATHOLOGY  COUNTS:  YES  TOURNIQUET:  * No tourniquets in log *  DICTATION: .Dragon Dictation  PLAN OF CARE: Discharge to home after PACU  PATIENT DISPOSITION:  PACU - hemodynamically stable.   Delay start of Pharmacological VTE agent (>24hrs) due to surgical blood loss or risk of bleeding: no

## 2018-07-19 NOTE — Discharge Instructions (Signed)

## 2018-07-19 NOTE — Op Note (Signed)
[facility] Patient Name: Yolanda SerHershela Sullivan Procedure Date: 07/19/2018 MRN: [id number] Attending MD: Kerin SalenArya Shaquira Moroz , MD Date of Birth: August 12, 1965 CSN: [id number] Age: 53 Admit Type: Inpatient Procedure:                Colonoscopy Indications:              Screening for colorectal malignant neoplasm, This                            is the patient's first colonoscopy Providers:                Kerin SalenArya Kaylyne Axton, MD, Glory RosebushJulie Gibson, RN, Brion AlimentShayla Proctor,                            Technician, Maricela CuretLaura Peters, CRNA Referring MD:              Medicines:                Monitored Anesthesia Care Complications:            No immediate complications. Estimated blood loss:                            None. Estimated Blood Loss:     Estimated blood loss: none. Procedure:                Pre-Anesthesia Assessment:                           - Prior to the procedure, a History and Physical                            was performed, and patient medications and                            allergies were reviewed. The patient's tolerance of                            previous anesthesia was also reviewed. The risks                            and benefits of the procedure and the sedation                            options and risks were discussed with the patient.                            All questions were answered, and informed consent                            was obtained. Prior Anticoagulants: The patient has                            taken no previous anticoagulant or antiplatelet                            agents. ASA Grade  Assessment: II - A patient with                            mild systemic disease. After reviewing the risks                            and benefits, the patient was deemed in                            satisfactory condition to undergo the procedure.                           After obtaining informed consent, the colonoscope                            was passed under  direct vision. Throughout the                            procedure, the patient's blood pressure, pulse, and                            oxygen saturations were monitored continuously. The                            PCF-H190DL ([id number] Olympus pediatric colonscope                            was introduced through the anus and advanced to the                            the terminal ileum. The colonoscopy was performed                            without difficulty. The patient tolerated the                            procedure well. The quality of the bowel                            preparation was good. Scope In: 8:13:49 AM Scope Out: 8:42:59 AM Scope Withdrawal Time: 0 hours 18 minutes 38 seconds  Total Procedure Duration: 0 hours 29 minutes 10 seconds  Findings:      The perianal and digital rectal examinations were normal.      Two sessile polyps were found in the transverse colon. The polyps were 2       to 3 mm in size. These polyps were removed with a cold biopsy forceps.       Resection and retrieval were complete.      A 3 mm polyp was found in the cecum. The polyp was sessile. The polyp       was removed with a cold biopsy forceps. Resection and retrieval were       complete.      A 6 mm polyp was found in the rectum. The polyp was sessile. The polyp  was removed with a hot snare. Resection and retrieval were complete.      The terminal ileum appeared normal.      A few small and large-mouthed diverticula were found in the sigmoid       colon and descending colon.      Non-bleeding internal hemorrhoids were found during retroflexion. The       hemorrhoids were small.      Anal papilla(e) were hypertrophied. Impression:               - Two 2 to 3 mm polyps in the transverse colon,                            removed with a cold biopsy forceps. Resected and                            retrieved.                           - One 3 mm polyp in the cecum, removed with a cold                             biopsy forceps. Resected and retrieved.                           - One 6 mm polyp in the rectum, removed with a hot                            snare. Resected and retrieved.                           - The examined portion of the ileum was normal.                           - Diverticulosis in the sigmoid colon and in the                            descending colon.                           - Non-bleeding internal hemorrhoids.                           - Anal papilla(e) were hypertrophied. Moderate Sedation:      Patient did not receive moderate sedation for this procedure, but       instead received monitored anesthesia care. Recommendation:           - Patient has a contact number available for                            emergencies. The signs and symptoms of potential                            delayed complications were discussed with the  patient. Return to normal activities tomorrow.                            Written discharge instructions were provided to the                            patient.                           - High fiber diet.                           - Continue present medications.                           - Await pathology results.                           - Repeat colonoscopy for surveillance based on                            pathology results. Procedure Code(s):        --- Professional ---                           [phone], Colonoscopy, flexible; with removal of                            tumor(s), polyp(s), or other lesion(s) by snare                            technique                           45380, 59, Colonoscopy, flexible; with biopsy,                            single or multiple Diagnosis Code(s):        --- Professional ---                           Z12.11, Encounter for screening for malignant                            neoplasm of colon                           K63.5, Polyp of colon                            K62.1, Rectal polyp                           K64.8, Other hemorrhoids                           K62.89, Other specified diseases of anus and rectum  K57.30, Diverticulosis of large intestine without                            perforation or abscess without bleeding CPT copyright 2019 [employer]. All rights reserved. The codes documented in this report are preliminary and upon coder review may  be revised to meet current compliance requirements. Kerin Salen, MD 07/19/2018 8:54:19 AM This report has been signed electronically. Number of Addenda: 0

## 2018-07-19 NOTE — Anesthesia Procedure Notes (Signed)
Procedure Name: MAC Date/Time: 07/19/2018 8:05 AM Performed by: Maxwell Caul, CRNA Pre-anesthesia Checklist: Patient identified, Emergency Drugs available, Suction available, Patient being monitored and Timeout performed Patient Re-evaluated:Patient Re-evaluated prior to induction Oxygen Delivery Method: Simple face mask

## 2019-03-14 IMAGING — CR DG KNEE COMPLETE 4+V*L*
4 series · 4 of 4 positions shown · non-contrast
Comparison: None.

CLINICAL DATA: Knee pain after twisting injury.

EXAM:
LEFT KNEE - COMPLETE 4+ VIEW

[t knee ap left *]
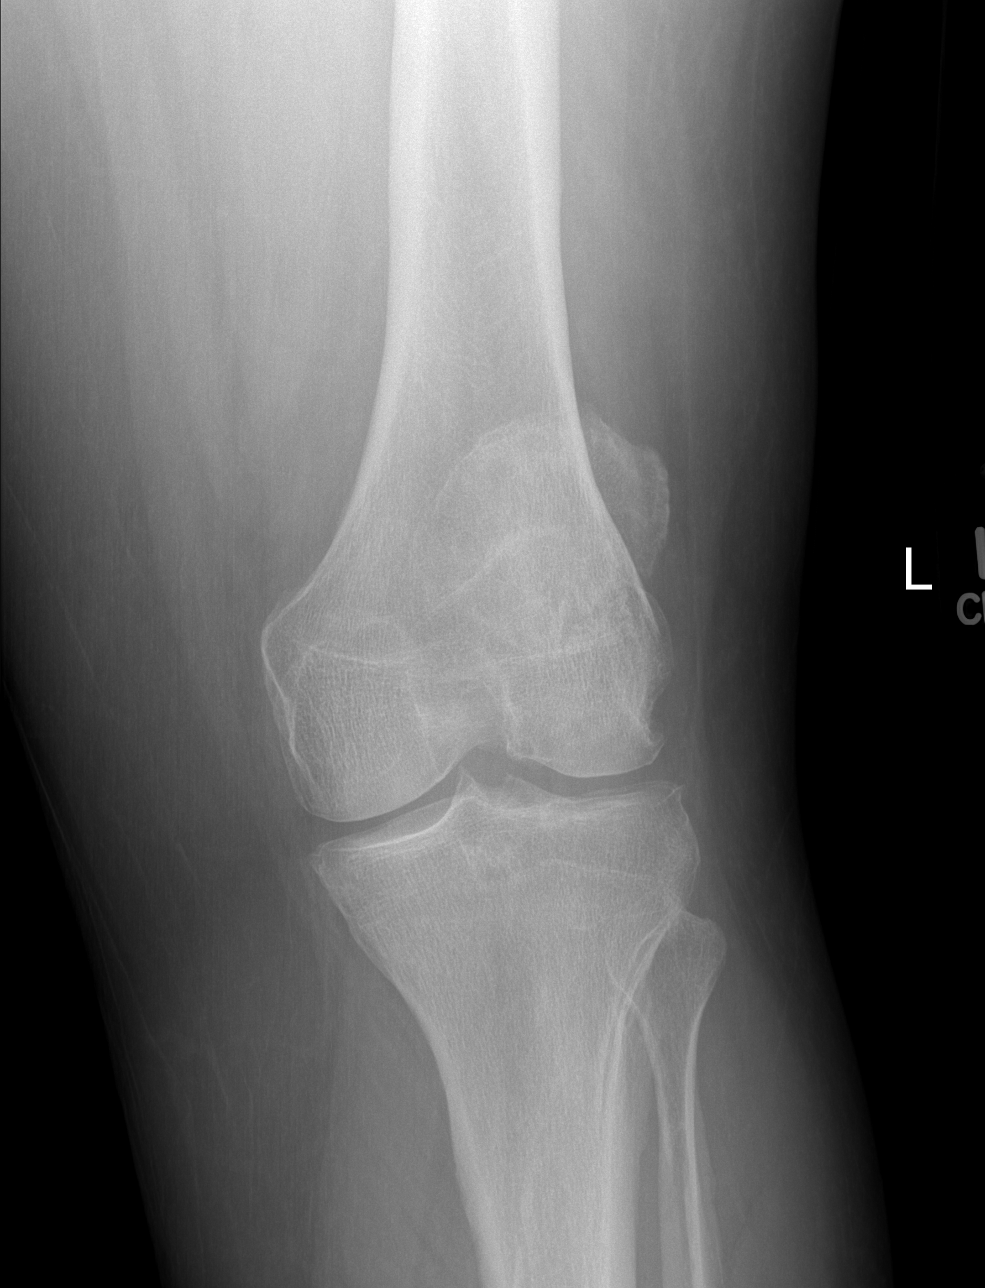

[t knee oblique left *]
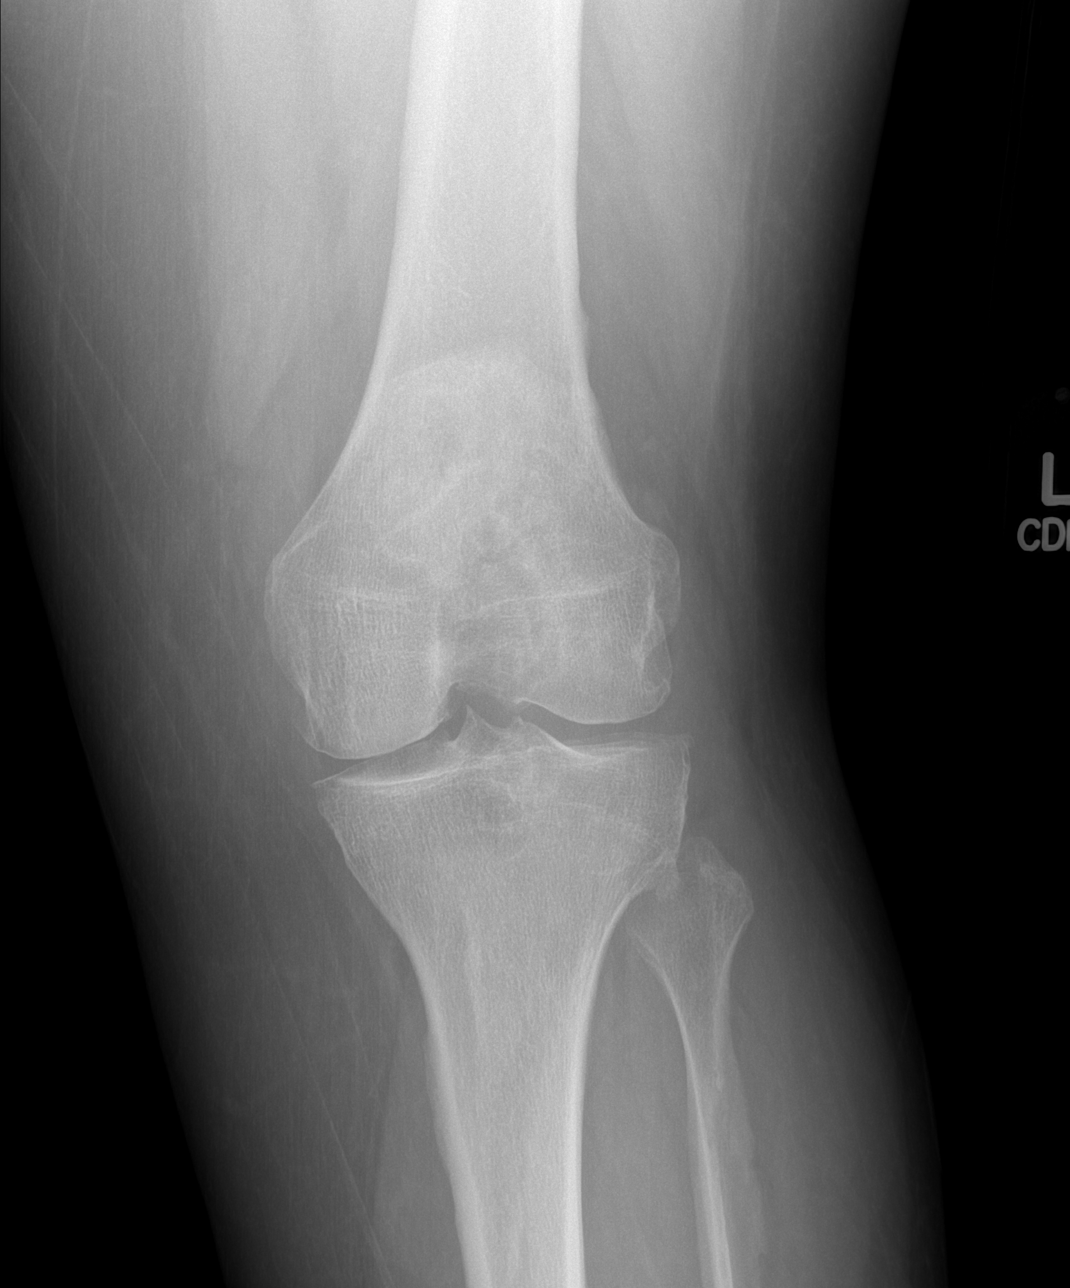

[t knee oblique left]
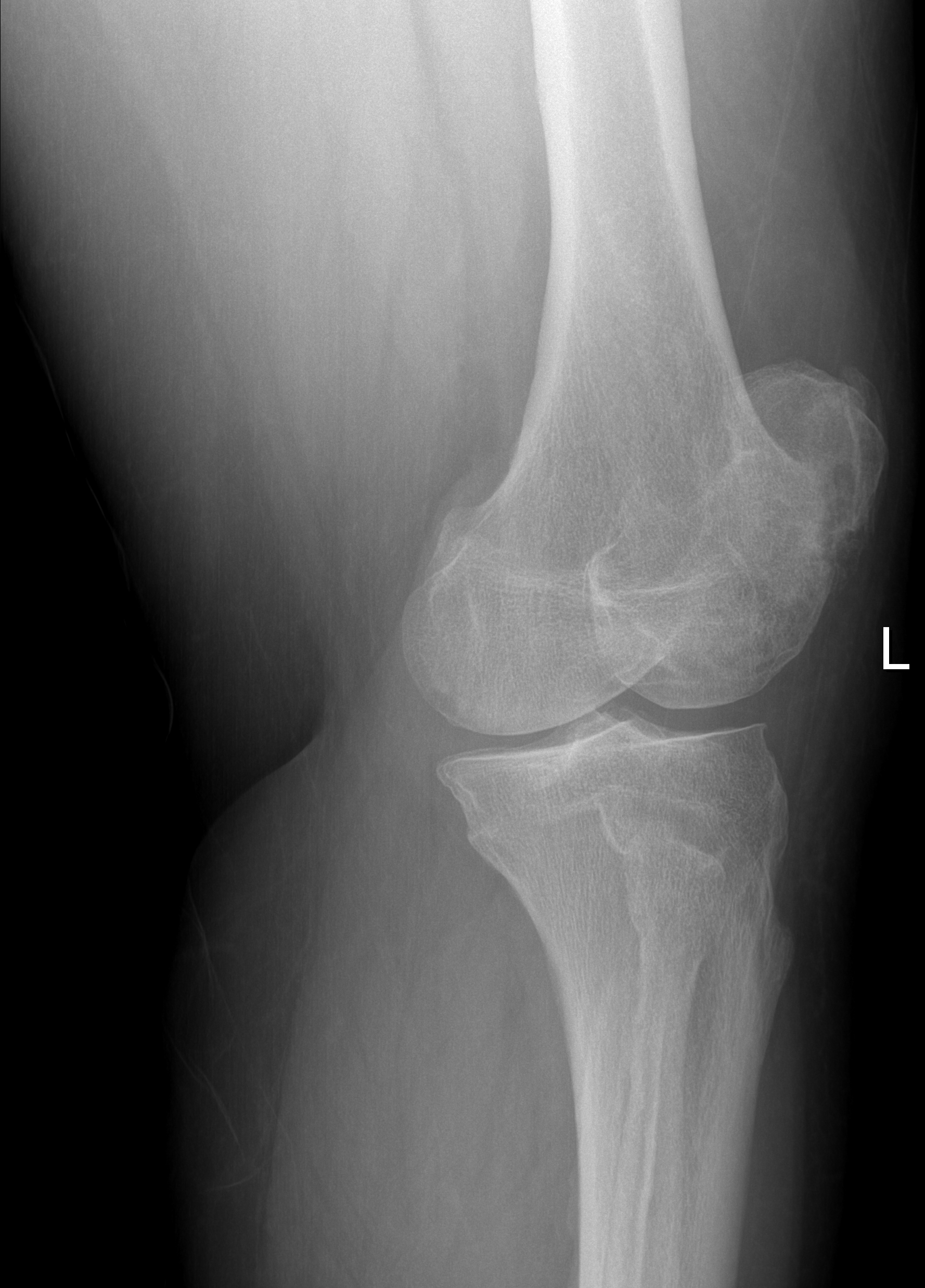

[t knee lat left]
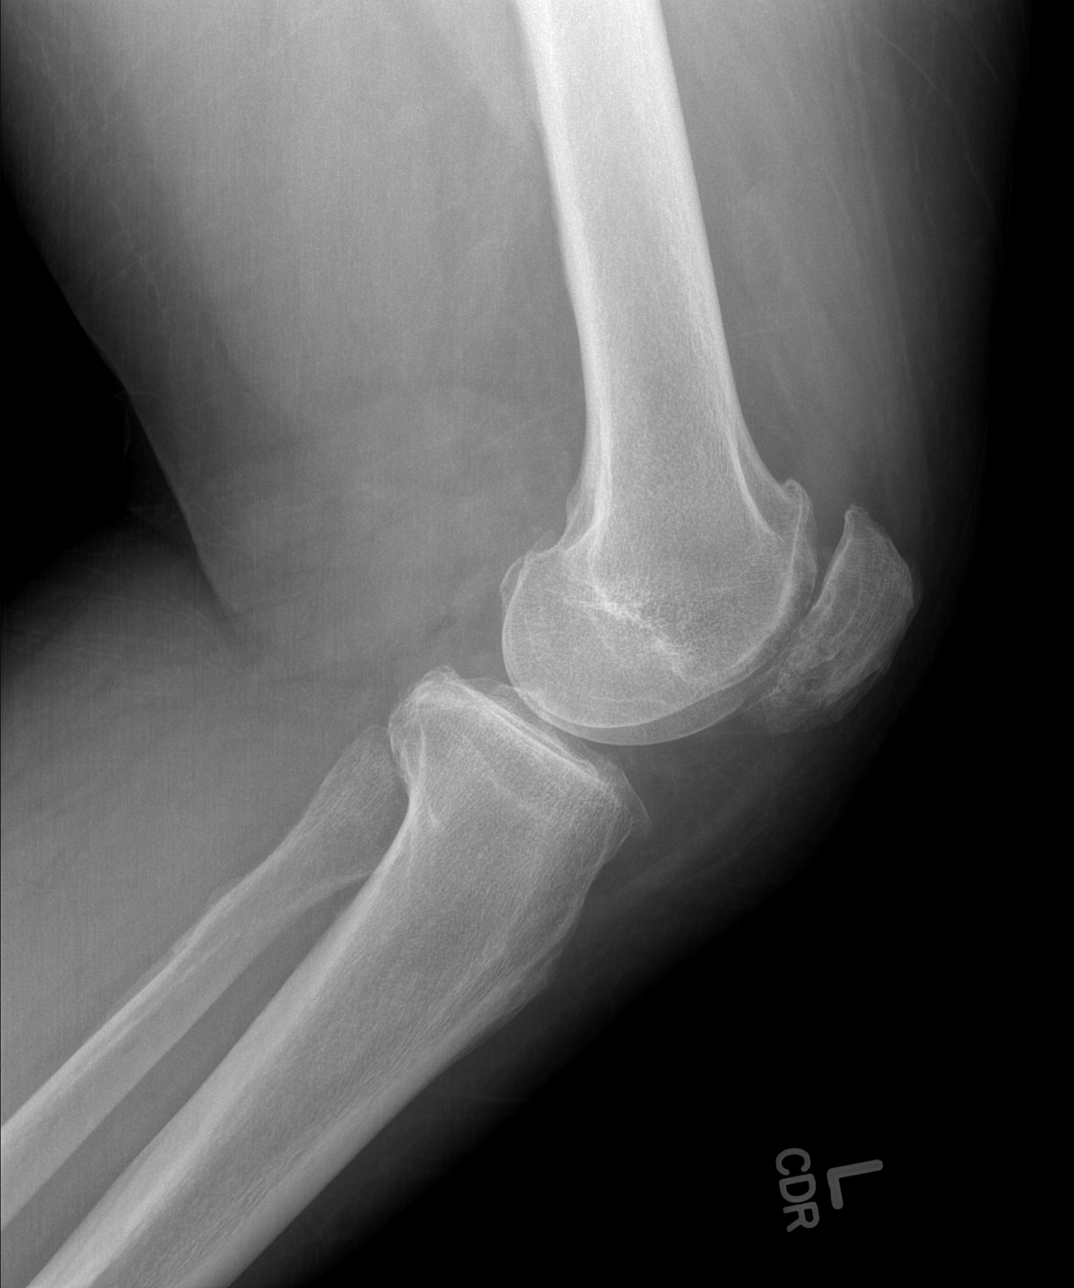

[4 of 4 positions shown; findings below may reference images not displayed]

FINDINGS: No acute fracture or dislocation. Small joint effusion. Small
tricompartmental osteophytes. Mild medial and patellofemoral
compartment joint space narrowing. Bone mineralization is normal.
Soft tissues are unremarkable.
IMPRESSION: 1.  No acute osseous abnormality.
2. Mild osteoarthritis.

## 2019-07-03 DIAGNOSIS — Z130 Encounter for screening for diseases of the blood and blood-forming organs and certain disorders involving the immune mechanism: Secondary | ICD-10-CM | POA: Diagnosis not present

## 2019-07-03 DIAGNOSIS — Z13228 Encounter for screening for other metabolic disorders: Secondary | ICD-10-CM | POA: Diagnosis not present

## 2019-07-03 DIAGNOSIS — Z13220 Encounter for screening for lipoid disorders: Secondary | ICD-10-CM | POA: Diagnosis not present

## 2019-07-03 DIAGNOSIS — Z1231 Encounter for screening mammogram for malignant neoplasm of breast: Secondary | ICD-10-CM | POA: Diagnosis not present

## 2019-07-03 DIAGNOSIS — Z01419 Encounter for gynecological examination (general) (routine) without abnormal findings: Secondary | ICD-10-CM | POA: Diagnosis not present

## 2019-07-03 DIAGNOSIS — Z13820 Encounter for screening for osteoporosis: Secondary | ICD-10-CM | POA: Diagnosis not present

## 2019-07-03 DIAGNOSIS — Z6841 Body Mass Index (BMI) 40.0 and over, adult: Secondary | ICD-10-CM | POA: Diagnosis not present

## 2019-07-03 DIAGNOSIS — Z1329 Encounter for screening for other suspected endocrine disorder: Secondary | ICD-10-CM | POA: Diagnosis not present

## 2022-07-31 ENCOUNTER — Other Ambulatory Visit: Payer: Self-pay | Admitting: Physician Assistant

## 2022-07-31 ENCOUNTER — Other Ambulatory Visit (INDEPENDENT_AMBULATORY_CARE_PROVIDER_SITE_OTHER): Payer: 59

## 2022-07-31 ENCOUNTER — Ambulatory Visit: Payer: 59 | Admitting: Physician Assistant

## 2022-07-31 ENCOUNTER — Encounter: Payer: Self-pay | Admitting: Physician Assistant

## 2022-07-31 DIAGNOSIS — M25561 Pain in right knee: Secondary | ICD-10-CM | POA: Diagnosis not present

## 2022-07-31 MED ORDER — MELOXICAM 15 MG PO TABS: 15.0000 mg | ORAL_TABLET | Freq: Every day | ORAL | 0 refills | Status: DC

## 2022-07-31 NOTE — Progress Notes (Signed)
Office Visit Note   Patient: Yolanda Sullivan           Date of Birth: Mar 28, 1965           MRN: [id number] Visit Date: 07/31/2022              Requested by: No referring provider defined for this encounter. PCP: Freddy Finner, MD (Inactive)  Chief Complaint  Patient presents with   Right Knee - Pain      HPI: Patient is a pleasant 57 year old woman who presents today with a chief complaint of right knee pain.  She denies any injury.  She has a remote history of injuring this right knee in college but has nothing recently.  She has had ongoing history of arthritis in her left knee.  She has gotten gel shots before.  Her right knee is not very symptomatic today but she does have some pain and is planning on going on a cruise denies any fever or chills.  Currently her pain is mild  Assessment & Plan: Visit Diagnoses:  1. Acute pain of right knee     Plan: She does have tricompartmental arthritis in the right knee most severe at the patellofemoral joint.  We talked about the natural history of this.  She is not very symptomatic now so I would not recommend a shot necessarily.  We did talk about Voltaren gel and starting on a regular dose of meloxicam a few days before her vacation with food.  She will continue it through vacation and discontinue it when she gets back.  She should not take other anti-inflammatories with it but can take Tylenol.  Could reconsider an injection if she got particularly painful We also discussed strengthening and have given her exercises to do on her own Follow-Up Instructions: No follow-ups on file.   Ortho Exam  Patient is alert, oriented, no adenopathy, well-dressed, normal affect, normal respiratory effort. Right knee she has no effusion no erythema compartments are soft and compressible she does have quite a bit of grinding.  Good varus valgus stability  Imaging: XR KNEE 3 VIEW RIGHT  Result Date: 07/31/2022 Three-view radiographs of her  right knee were obtained today well-maintained alignment.  She has tricompartmental arthritis with sclerotic changes and joint space narrowing changes are advanced especially in the patellofemoral joint  No images are attached to the encounter.  Labs: No results found for: "HGBA1C", "ESRSEDRATE", "CRP", "LABURIC", "REPTSTATUS", "GRAMSTAIN", "CULT", "LABORGA"   Lab Results  Component Value Date   ALBUMIN 4.0 06/24/2009    No results found for: "MG" No results found for: "VD25OH"  No results found for: "PREALBUMIN"    Latest Ref Rng & Units 06/30/2009    5:00 AM 06/29/2009    5:55 PM 06/24/2009   10:10 AM  CBC EXTENDED  WBC 4.0 - 10.5 K/uL 9.5   5.2   RBC 3.87 - 5.11 MIL/uL 3.84   4.29   Hemoglobin 12.0 - 15.0 g/dL 40.9  81.1  91.4   HCT 36.0 - 46.0 % 34.2  35.6  38.5   Platelets 150 - 400 K/uL 281   290   NEUT# 1.7 - 7.7 K/uL 7.6   2.1   Lymph# 0.7 - 4.0 K/uL 1.3   2.5      There is no height or weight on file to calculate BMI.  Orders:  Orders Placed This Encounter  Procedures   XR KNEE 3 VIEW RIGHT   No orders of the defined  types were placed in this encounter.    Procedures: No procedures performed  Clinical Data: No additional findings.  ROS:  All other systems negative, except as noted in the HPI. Review of Systems  Objective: Vital Signs: There were no vitals taken for this visit.  Specialty Comments:  No specialty comments available.  PMFS History: Patient Active Problem List   Diagnosis Date Noted   Morbid obesity (HCC) 02/12/2013   Past Medical History:  Diagnosis Date   Hypothyroidism    Obesity     Family History  Problem Relation Age of Onset   Cancer Father        throat   Hyperlipidemia Other    Diabetes Other    Hypertension Other     Past Surgical History:  Procedure Laterality Date   BIOPSY  07/19/2018   Procedure: BIOPSY;  Surgeon: Kerin Salen, MD;  Location: [facility];  Service: Gastroenterology;;   CESAREAN SECTION      COLONOSCOPY WITH PROPOFOL N/A 07/19/2018   Procedure: COLONOSCOPY WITH PROPOFOL;  Surgeon: Kerin Salen, MD;  Location: [facility];  Service: Gastroenterology;  Laterality: N/A;   LAPAROSCOPIC GASTRIC BANDING     POLYPECTOMY  07/19/2018   Procedure: POLYPECTOMY;  Surgeon: Kerin Salen, MD;  Location: [facility];  Service: Gastroenterology;;   TONSILLECTOMY  1977   Social History   Occupational History   Not on file  Tobacco Use   Smoking status: Never   Smokeless tobacco: Never  Substance and Sexual Activity   Alcohol use: Yes    Alcohol/week: 2.0 standard drinks of alcohol    Types: 2 Glasses of wine per week    Comment: weekly   Drug use: No   Sexual activity: Not on file

## 2022-08-29 ENCOUNTER — Other Ambulatory Visit: Payer: Self-pay | Admitting: Physician Assistant

## 2022-09-13 ENCOUNTER — Other Ambulatory Visit: Payer: Self-pay | Admitting: Family

## 2022-09-13 DIAGNOSIS — N6310 Unspecified lump in the right breast, unspecified quadrant: Secondary | ICD-10-CM

## 2022-09-21 ENCOUNTER — Ambulatory Visit
Admission: RE | Admit: 2022-09-21 | Discharge: 2022-09-21 | Disposition: A | Discharge status: Home / Self Care | Payer: 59 | Source: Ambulatory Visit | Attending: Family | Admitting: Family

## 2022-09-21 ENCOUNTER — Other Ambulatory Visit: Payer: Self-pay | Admitting: Family

## 2022-09-21 DIAGNOSIS — N6310 Unspecified lump in the right breast, unspecified quadrant: Secondary | ICD-10-CM

## 2022-10-03 ENCOUNTER — Other Ambulatory Visit (HOSPITAL_COMMUNITY): Payer: Self-pay

## 2022-10-03 MED ORDER — ZEPBOUND 2.5 MG/0.5ML ~~LOC~~ SOAJ
2.5000 mg | SUBCUTANEOUS | 1 refills | Status: AC
Start: 2022-10-03 — End: ?
  Filled 2022-10-03 – 2022-10-04 (×2): qty 2, 28d supply, fill #0

## 2022-10-04 ENCOUNTER — Other Ambulatory Visit: Payer: Self-pay | Admitting: Physician Assistant

## 2022-10-04 ENCOUNTER — Other Ambulatory Visit (HOSPITAL_COMMUNITY): Payer: Self-pay

## 2022-10-04 MED ORDER — ZEPBOUND 2.5 MG/0.5ML ~~LOC~~ SOAJ
2.5000 mg | SUBCUTANEOUS | 0 refills | Status: AC
  Filled 2022-10-04 – 2022-10-06 (×3): qty 2, 28d supply, fill #0

## 2022-10-05 ENCOUNTER — Other Ambulatory Visit (HOSPITAL_COMMUNITY): Payer: Self-pay

## 2022-10-05 ENCOUNTER — Encounter (HOSPITAL_COMMUNITY): Payer: Self-pay

## 2022-10-06 ENCOUNTER — Other Ambulatory Visit (HOSPITAL_COMMUNITY): Payer: Self-pay

## 2022-10-07 ENCOUNTER — Other Ambulatory Visit (HOSPITAL_COMMUNITY): Payer: Self-pay

## 2022-10-09 ENCOUNTER — Other Ambulatory Visit (HOSPITAL_COMMUNITY): Payer: Self-pay

## 2022-10-24 ENCOUNTER — Other Ambulatory Visit (HOSPITAL_COMMUNITY): Payer: Self-pay

## 2022-10-25 ENCOUNTER — Other Ambulatory Visit (HOSPITAL_COMMUNITY): Payer: Self-pay

## 2022-10-25 MED ORDER — ZEPBOUND 2.5 MG/0.5ML ~~LOC~~ SOAJ
SUBCUTANEOUS | 0 refills | Status: AC
  Filled 2022-10-25: qty 2, 28d supply, fill #0

## 2022-10-26 ENCOUNTER — Other Ambulatory Visit (HOSPITAL_COMMUNITY): Payer: Self-pay

## 2022-11-02 ENCOUNTER — Other Ambulatory Visit (HOSPITAL_COMMUNITY): Payer: Self-pay

## 2022-11-06 ENCOUNTER — Other Ambulatory Visit (HOSPITAL_COMMUNITY): Payer: Self-pay

## 2022-11-07 ENCOUNTER — Other Ambulatory Visit (HOSPITAL_COMMUNITY): Payer: Self-pay

## 2022-11-07 MED ORDER — ZEPBOUND 5 MG/0.5ML ~~LOC~~ SOAJ
5.0000 mg | SUBCUTANEOUS | 1 refills | Status: DC
  Filled 2022-11-07: qty 2, 28d supply, fill #0
  Filled 2022-12-05: qty 2, 28d supply, fill #1

## 2022-11-22 ENCOUNTER — Other Ambulatory Visit (HOSPITAL_COMMUNITY): Payer: Self-pay

## 2022-11-27 ENCOUNTER — Other Ambulatory Visit (HOSPITAL_COMMUNITY): Payer: Self-pay

## 2022-12-04 ENCOUNTER — Other Ambulatory Visit (HOSPITAL_COMMUNITY): Payer: Self-pay

## 2022-12-25 ENCOUNTER — Other Ambulatory Visit (HOSPITAL_COMMUNITY): Payer: Self-pay

## 2022-12-26 ENCOUNTER — Other Ambulatory Visit (HOSPITAL_COMMUNITY): Payer: Self-pay

## 2022-12-26 MED ORDER — ZEPBOUND 5 MG/0.5ML ~~LOC~~ SOAJ
5.0000 mg | SUBCUTANEOUS | 1 refills | Status: AC
  Filled 2022-12-26 – 2023-01-01 (×2): qty 2, 28d supply, fill #0

## 2023-01-01 ENCOUNTER — Other Ambulatory Visit (HOSPITAL_COMMUNITY): Payer: Self-pay
